# Patient Record
Sex: Female | Born: 1955
Health system: Southern US, Community
[De-identification: ages and names within clinical notes are randomized; demographics above are authoritative.]

## PROBLEM LIST (undated history)

## (undated) HISTORY — PX: HERNIA REPAIR: SHX51

---

## 2002-02-02 ENCOUNTER — Other Ambulatory Visit: Admission: RE | Admit: 2002-02-02 | Discharge: 2002-02-02 | Payer: Self-pay | Admitting: Family Medicine

## 2003-02-15 ENCOUNTER — Other Ambulatory Visit: Admission: RE | Admit: 2003-02-15 | Discharge: 2003-02-15 | Payer: Self-pay | Admitting: Family Medicine

## 2004-05-13 ENCOUNTER — Other Ambulatory Visit: Admission: RE | Admit: 2004-05-13 | Discharge: 2004-05-13 | Payer: Self-pay | Admitting: Family Medicine

## 2004-12-05 ENCOUNTER — Encounter: Admission: RE | Admit: 2004-12-05 | Discharge: 2004-12-05 | Payer: Self-pay | Admitting: Family Medicine

## 2005-01-15 ENCOUNTER — Encounter: Admission: RE | Admit: 2005-01-15 | Discharge: 2005-01-15 | Payer: Self-pay | Admitting: Family Medicine

## 2005-10-07 ENCOUNTER — Other Ambulatory Visit: Admission: RE | Admit: 2005-10-07 | Discharge: 2005-10-07 | Payer: Self-pay | Admitting: Family Medicine

## 2006-01-13 ENCOUNTER — Ambulatory Visit (HOSPITAL_BASED_OUTPATIENT_CLINIC_OR_DEPARTMENT_OTHER): Admission: RE | Admit: 2006-01-13 | Discharge: 2006-01-13 | Payer: Self-pay | Admitting: Orthopedic Surgery

## 2006-10-20 ENCOUNTER — Other Ambulatory Visit: Admission: RE | Admit: 2006-10-20 | Discharge: 2006-10-20 | Payer: Self-pay | Admitting: Family Medicine

## 2007-10-27 ENCOUNTER — Other Ambulatory Visit: Admission: RE | Admit: 2007-10-27 | Discharge: 2007-10-27 | Payer: Self-pay | Admitting: Family Medicine

## 2008-10-30 ENCOUNTER — Other Ambulatory Visit: Admission: RE | Admit: 2008-10-30 | Discharge: 2008-10-30 | Payer: Self-pay | Admitting: Family Medicine

## 2009-11-20 ENCOUNTER — Other Ambulatory Visit: Admission: RE | Admit: 2009-11-20 | Discharge: 2009-11-20 | Payer: Self-pay | Admitting: Family Medicine

## 2010-09-12 NOTE — Op Note (Signed)
Lydia Turner, Lydia Turner                ACCOUNT NO.:  [id number]   MEDICAL RECORD NO.:  [id number]          PATIENT TYPE:  AMB   LOCATION:  DSC                          FACILITY:  [facility]   PHYSICIAN:  Mila Homer. Sherlean Foot, M.D. DATE OF BIRTH:  08-28-1955   DATE OF PROCEDURE:  01/13/2006  DATE OF DISCHARGE:                                 OPERATIVE REPORT   SURGEON:  Lucey.   ASSISTANT:  None.   ANESTHESIA:  General.   PREOPERATIVE DIAGNOSIS:  Right knee, medial meniscus tear, osteoarthritis.   POSTOPERATIVE DIAGNOSIS:  Right knee, medial meniscus tear, lateral meniscus  tear; osteoarthritis.   INDICATIONS FOR PROCEDURE:  Patient is a 55 year old black female with a can  of symptoms, MRI evidence of meniscus tearing and radiographic evidence of  arthritis.  Informed consent was obtained.   DESCRIPTION OF PROCEDURE:  The patient was laid supine and administered MAC  anesthesia at first, and we converted to a general LMA.  The right lower  extremity was prepped and draped in the usual sterile fashion.  Inferolateral and inferomedial portals were created with a ##11 blade, blunt  trocar and cannula.  Diagnostic arthroscopy revealed grade 3 and 4  chondromalacia on the left facet of the patella and trochlea.  Chondroplasty  was performed on both surfaces with the 4.2 [employer] shaver.  Then went  into the medial compartment.  There was a very large, very complex posterior  horn tear which was flipped into the posterior joint.  It took a great deal  of effort to get that back into the joint, and then a partial medial  meniscectomy was performed with straight and upbiting basket forceps and a  Biochemist, clinical.  I then went into the figure-of-4 position.  There was  some radial tearing of the lateral meniscus.  This was cleaned up with a  straight-basket forceps and the [employer].  There was some grade 3  chondromalacia on the tibial plateau in the lateral compartment and some  grade 2 in the majority of the medial femoral condyle.  ACL and PCL appeared  normal.  I then took one further tour with lavage and then closed with 4-0  nylon sutures.  Dressed with Xeroform dressing.   COMPLICATIONS:  None.   DRAINS:  None.           ______________________________  Mila Homer. Sherlean Foot, M.D.     SDL/MEDQ  D:  01/13/2006  T:  01/13/2006  Job:  [id number]

## 2010-11-25 ENCOUNTER — Other Ambulatory Visit (HOSPITAL_COMMUNITY)
Admission: RE | Admit: 2010-11-25 | Discharge: 2010-11-25 | Disposition: A | Discharge status: Home / Self Care | Payer: BC Managed Care – PPO | Source: Ambulatory Visit | Attending: Family Medicine | Admitting: Family Medicine

## 2010-11-25 DIAGNOSIS — Z01419 Encounter for gynecological examination (general) (routine) without abnormal findings: Secondary | ICD-10-CM | POA: Insufficient documentation

## 2011-06-07 ENCOUNTER — Emergency Department (INDEPENDENT_AMBULATORY_CARE_PROVIDER_SITE_OTHER): Payer: Worker's Compensation

## 2011-06-07 ENCOUNTER — Encounter (HOSPITAL_BASED_OUTPATIENT_CLINIC_OR_DEPARTMENT_OTHER): Payer: Self-pay | Admitting: *Deleted

## 2011-06-07 ENCOUNTER — Emergency Department (HOSPITAL_BASED_OUTPATIENT_CLINIC_OR_DEPARTMENT_OTHER)
Admission: EM | Admit: 2011-06-07 | Discharge: 2011-06-07 | Disposition: A | Discharge status: Home / Self Care | Payer: Worker's Compensation | Attending: Emergency Medicine | Admitting: Emergency Medicine

## 2011-06-07 DIAGNOSIS — S8002XA Contusion of left knee, initial encounter: Secondary | ICD-10-CM

## 2011-06-07 DIAGNOSIS — W010XXA Fall on same level from slipping, tripping and stumbling without subsequent striking against object, initial encounter: Secondary | ICD-10-CM | POA: Insufficient documentation

## 2011-06-07 DIAGNOSIS — S8000XA Contusion of unspecified knee, initial encounter: Secondary | ICD-10-CM | POA: Insufficient documentation

## 2011-06-07 DIAGNOSIS — M25569 Pain in unspecified knee: Secondary | ICD-10-CM

## 2011-06-07 NOTE — ED Notes (Signed)
Pt states her foot got caught in some luggage and she fell on her left knee.

## 2011-06-07 NOTE — ED Provider Notes (Signed)
History     CSN: [id number]  Arrival date & time 06/07/11  7829   First MD Initiated Contact with Patient 06/07/11 2028      Chief Complaint  Patient presents with  . Knee Pain    (Consider location/radiation/quality/duration/timing/severity/associated sxs/prior treatment) Patient is a 56 y.o. female presenting with knee pain. The history is provided by the patient. No language interpreter was used.  Knee Pain This is a new problem. The current episode started today. The problem occurs constantly. The problem has been unchanged. Associated symptoms include joint swelling. The symptoms are aggravated by bending. She has tried ice for the symptoms. The treatment provided mild relief.  Pt reports she tripped over a ski bag and fell hitting her knee.  Pt complains of swelling and bruising.  No other areas of injury.  History reviewed. No pertinent past medical history.  Past Surgical History  Procedure Date  . Hernia repair   . Cesarean section     History reviewed. No pertinent family history.  History  Substance Use Topics  . Smoking status: Never Smoker   . Smokeless tobacco: Not on file  . Alcohol Use: No    OB History    Grav Para Term Preterm Abortions TAB SAB Ect Mult Living                  Review of Systems  Musculoskeletal: Positive for joint swelling.  All other systems reviewed and are negative.    Allergies  Ibuprofen and Peanut-containing drug products  Home Medications   Current Outpatient Rx  Name Route Sig Dispense Refill  . COD LIVER OIL PO CAPS Oral Take 1 capsule by mouth daily.      BP 119/84  Pulse 94  Temp(Src) 98.8 F (37.1 C) (Oral)  Resp 18  Ht 5\' 2"  (1.575 m)  Wt 210 lb (95.255 kg)  BMI 38.41 kg/m2  SpO2 100%  Physical Exam  Vitals reviewed. Constitutional: She is oriented to person, place, and time. She appears well-developed and well-nourished.  HENT:  Head: Normocephalic.  Musculoskeletal: Normal range of motion. She  exhibits tenderness.       12x10 cm area of bruising left leg and left knee,  From knee,  No instability med or lat  Negative drawer,  nv and ns intact  Neurological: She is alert and oriented to person, place, and time.  Skin: Skin is warm. There is erythema.  Psychiatric: She has a normal mood and affect.    ED Course  Procedures (including critical care time)  Labs Reviewed - No data to display Dg Knee Complete 4 Views Left  06/07/2011  *RADIOLOGY REPORT*  Clinical Data: Left knee pain.  LEFT KNEE - COMPLETE 4+ VIEW  Comparison: None  Findings: There are moderate degenerative changes but no acute fracture or osteochondral abnormality.  No joint effusion.  IMPRESSION: Moderate degenerative changes but no acute fracture.  Original Report Authenticated By: P. Loralie Champagne, M.D.     No diagnosis found.    MDM  Ace wrap,  I advised ice,  Return if any problems.        Langston Masker, [place] 06/07/11 2101

## 2011-06-08 NOTE — ED Provider Notes (Signed)
Medical screening examination/treatment/procedure(s) were performed by non-physician practitioner and as supervising physician I was immediately available for consultation/collaboration.   Forbes Cellar, MD 06/08/11 [phone]

## 2011-12-25 ENCOUNTER — Other Ambulatory Visit (HOSPITAL_COMMUNITY)
Admission: RE | Admit: 2011-12-25 | Discharge: 2011-12-25 | Disposition: A | Discharge status: Home / Self Care | Payer: BC Managed Care – PPO | Source: Ambulatory Visit | Attending: Family Medicine | Admitting: Family Medicine

## 2011-12-25 DIAGNOSIS — Z01419 Encounter for gynecological examination (general) (routine) without abnormal findings: Secondary | ICD-10-CM | POA: Insufficient documentation

## 2013-07-24 IMAGING — CR DG KNEE COMPLETE 4+V*L*
4 series · 4 of 4 positions shown · non-contrast
Comparison: None

CLINICAL DATA: Left knee pain.

LEFT KNEE - COMPLETE 4+ VIEW

[t knee ap left]
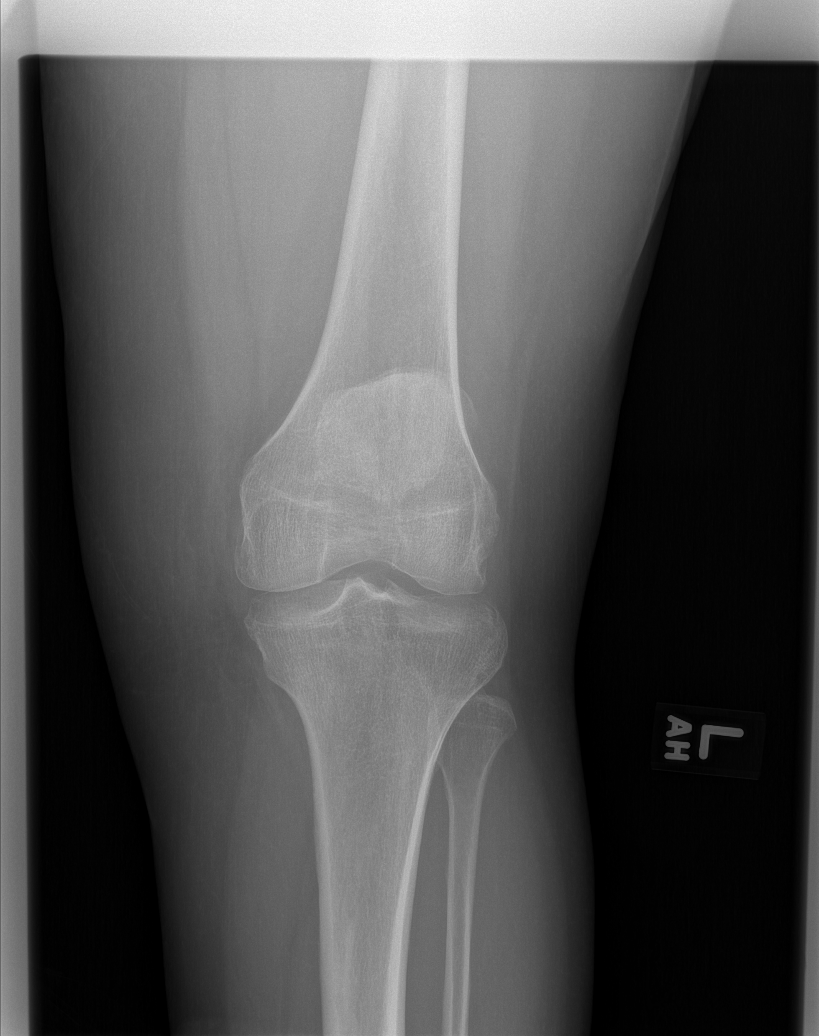

[t knee oblique left (1 of 2)]
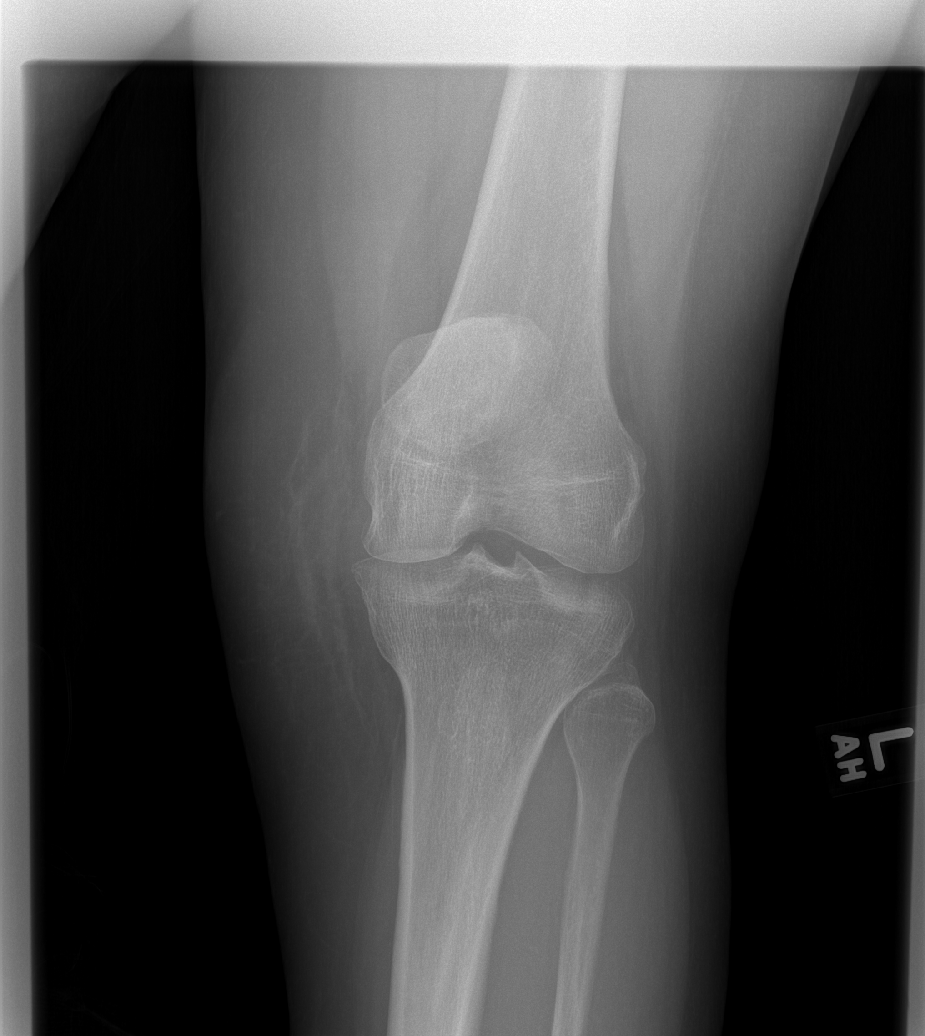

[t knee oblique left (2 of 2)]
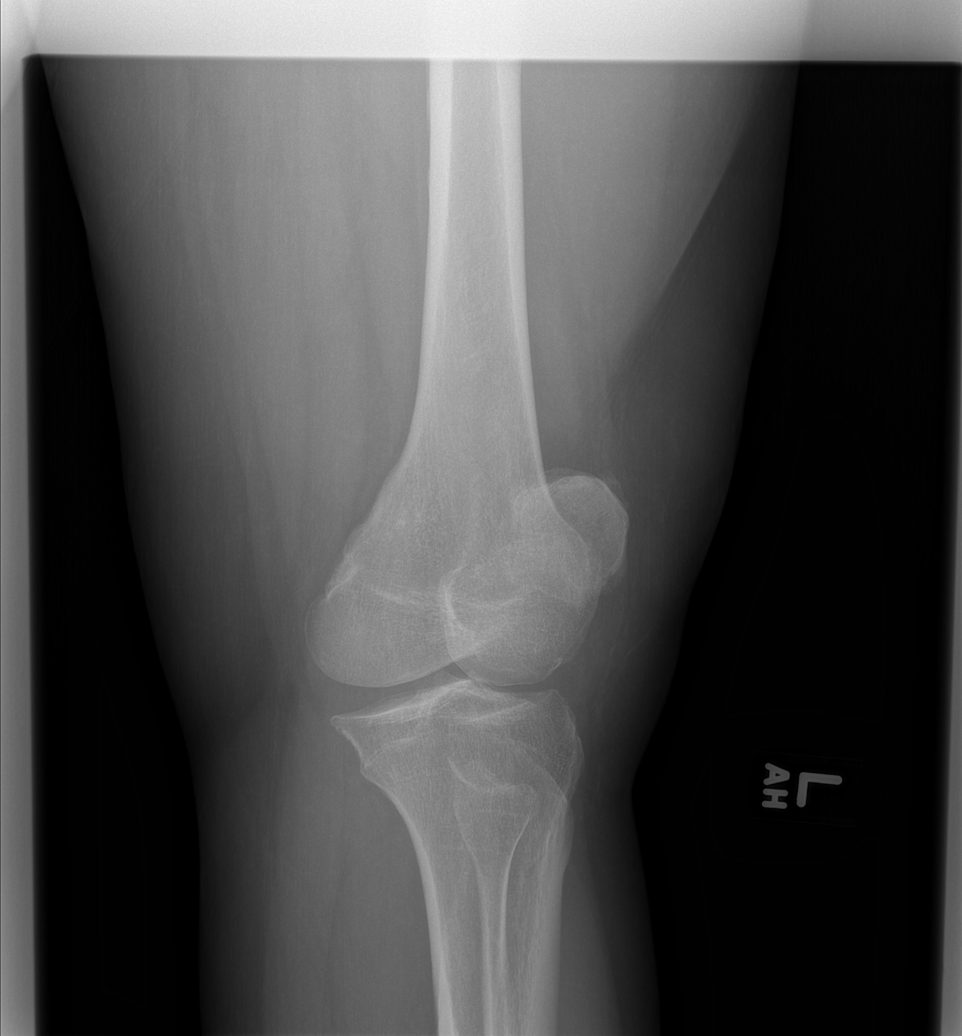

[t knee lat left]
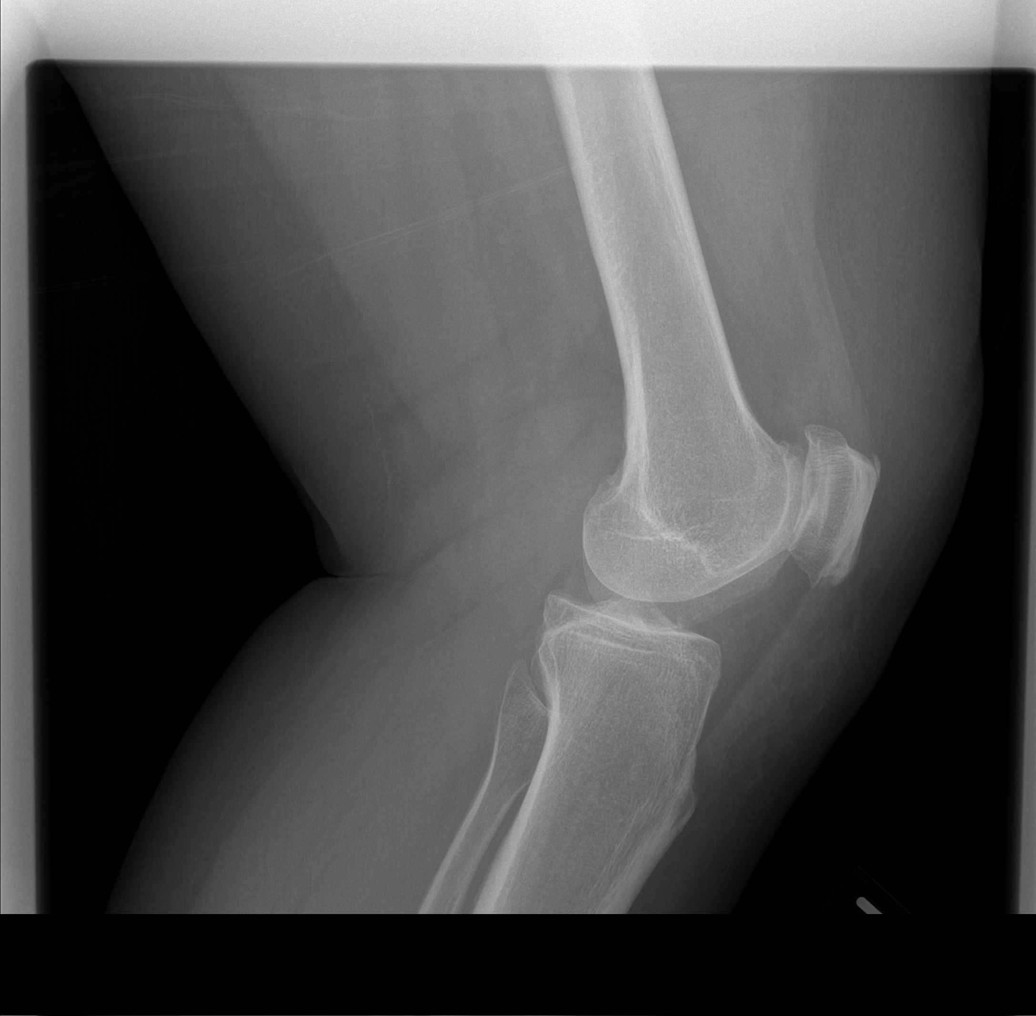

[4 of 4 positions shown; findings below may reference images not displayed]

FINDINGS: There are moderate degenerative changes but no acute
fracture or osteochondral abnormality.  No joint effusion.
IMPRESSION: Moderate degenerative changes but no acute fracture.

## 2013-12-27 ENCOUNTER — Other Ambulatory Visit (HOSPITAL_COMMUNITY)
Admission: RE | Admit: 2013-12-27 | Discharge: 2013-12-27 | Disposition: A | Discharge status: Home / Self Care | Payer: BC Managed Care – PPO | Source: Ambulatory Visit | Attending: Family Medicine | Admitting: Family Medicine

## 2013-12-27 DIAGNOSIS — Z01419 Encounter for gynecological examination (general) (routine) without abnormal findings: Secondary | ICD-10-CM | POA: Diagnosis present

## 2013-12-27 DIAGNOSIS — Z1151 Encounter for screening for human papillomavirus (HPV): Secondary | ICD-10-CM | POA: Insufficient documentation

## 2015-01-01 ENCOUNTER — Other Ambulatory Visit (HOSPITAL_COMMUNITY)
Admission: RE | Admit: 2015-01-01 | Discharge: 2015-01-01 | Disposition: A | Discharge status: Home / Self Care | Payer: BLUE CROSS/BLUE SHIELD | Source: Ambulatory Visit | Attending: Family Medicine | Admitting: Family Medicine

## 2015-01-01 DIAGNOSIS — Z1151 Encounter for screening for human papillomavirus (HPV): Secondary | ICD-10-CM | POA: Diagnosis present

## 2015-01-01 DIAGNOSIS — Z01419 Encounter for gynecological examination (general) (routine) without abnormal findings: Secondary | ICD-10-CM | POA: Insufficient documentation

## 2016-01-07 ENCOUNTER — Other Ambulatory Visit: Payer: Self-pay | Admitting: Family Medicine

## 2016-01-07 ENCOUNTER — Other Ambulatory Visit (HOSPITAL_COMMUNITY)
Admission: RE | Admit: 2016-01-07 | Discharge: 2016-01-07 | Disposition: A | Discharge status: Home / Self Care | Payer: BLUE CROSS/BLUE SHIELD | Source: Ambulatory Visit | Attending: Family Medicine | Admitting: Family Medicine

## 2016-01-07 DIAGNOSIS — Z1151 Encounter for screening for human papillomavirus (HPV): Secondary | ICD-10-CM | POA: Insufficient documentation

## 2016-01-07 DIAGNOSIS — Z01419 Encounter for gynecological examination (general) (routine) without abnormal findings: Secondary | ICD-10-CM | POA: Diagnosis present

## 2016-01-09 LAB — CYTOLOGY - PAP

## 2017-01-22 ENCOUNTER — Ambulatory Visit (INDEPENDENT_AMBULATORY_CARE_PROVIDER_SITE_OTHER): Payer: BLUE CROSS/BLUE SHIELD | Admitting: Podiatry

## 2017-01-22 ENCOUNTER — Ambulatory Visit (INDEPENDENT_AMBULATORY_CARE_PROVIDER_SITE_OTHER): Payer: BLUE CROSS/BLUE SHIELD

## 2017-01-22 ENCOUNTER — Encounter: Payer: Self-pay | Admitting: Podiatry

## 2017-01-22 VITALS — BP 127/75 | HR 65

## 2017-01-22 DIAGNOSIS — M2010 Hallux valgus (acquired), unspecified foot: Secondary | ICD-10-CM

## 2017-01-22 NOTE — Progress Notes (Signed)
   Subjective:    Patient ID: Lydia Turner, female    DOB: [birth year], 61 y.o.   MRN: [id number]  HPI  Chief Complaint  Patient presents with  . Foot Pain    Rt foot possible bunion       Review of Systems  Musculoskeletal: Positive for arthralgias.  All other systems reviewed and are negative.      Objective:   Physical Exam        Assessment & Plan:

## 2017-01-22 NOTE — Patient Instructions (Signed)

## 2017-01-22 NOTE — Progress Notes (Signed)
Subjective:    Patient ID: Lydia Turner, female   DOB: 61 y.o.   MRN: [id number]   HPI patient states that she's had a painful bunion deformity right over left that's been present for a while and getting worse and making shoe gear difficult. She's tried changes and shoe gear soaks to try to help and patient states she's not a smoker and does like to be active    Review of Systems  All other systems reviewed and are negative.       Objective:  Physical Exam  Constitutional: She appears well-developed and well-nourished.  Cardiovascular: Intact distal pulses.   Musculoskeletal: Normal range of motion.  Neurological: She is alert.  Skin: Skin is warm.  Nursing note and vitals reviewed.  neurovascular status intact muscle strength adequate range of motion within normal limits with patient found to have hyperostosis medial aspect first metatarsal right over left with redness and moderate discomfort when the right is palpated. Patient is noted to have good digital perfusion and is well oriented 3     Assessment:    Structural HAV deformity right over left with inflammation pain and gradual increase in symptoms     Plan:    H&P condition reviewed and at this point I recommended long-term surgical intervention and short-term shoe gear modification. Patient wants to surgery but is not sure on schedule and will call to schedule and will be seen back prior to procedure and I educated her on distal osteotomy. Patient encouraged to call with questions  X-rays indicate elevation of the 1 to intermetatarsal angle right over left of approximate 15

## 2017-07-22 ENCOUNTER — Encounter: Payer: BLUE CROSS/BLUE SHIELD | Attending: Psychology | Admitting: Psychology

## 2017-07-22 DIAGNOSIS — F09 Unspecified mental disorder due to known physiological condition: Secondary | ICD-10-CM | POA: Insufficient documentation

## 2017-07-29 ENCOUNTER — Encounter: Payer: Self-pay | Admitting: Psychology

## 2017-07-29 ENCOUNTER — Encounter: Payer: BLUE CROSS/BLUE SHIELD | Attending: Psychology | Admitting: Psychology

## 2017-07-29 DIAGNOSIS — F09 Unspecified mental disorder due to known physiological condition: Secondary | ICD-10-CM | POA: Diagnosis present

## 2017-07-29 DIAGNOSIS — F0789 Other personality and behavioral disorders due to known physiological condition: Secondary | ICD-10-CM | POA: Diagnosis not present

## 2017-07-29 NOTE — Progress Notes (Signed)
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  2 hours testing face to face administered by Physician/Psychologist

## 2017-08-05 ENCOUNTER — Encounter: Payer: Self-pay | Admitting: Psychology

## 2017-08-05 ENCOUNTER — Encounter (HOSPITAL_BASED_OUTPATIENT_CLINIC_OR_DEPARTMENT_OTHER): Payer: BLUE CROSS/BLUE SHIELD | Admitting: Psychology

## 2017-08-05 DIAGNOSIS — F09 Unspecified mental disorder due to known physiological condition: Secondary | ICD-10-CM | POA: Diagnosis not present
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
## 2017-08-26 ENCOUNTER — Telehealth: Payer: Self-pay

## 2017-08-26 NOTE — Telephone Encounter (Signed)
Pt called stating her job needed an update.

## 2017-09-01 NOTE — Telephone Encounter (Signed)
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 She states fax it to [employer] and you have the number.

## 2017-09-13 ENCOUNTER — Encounter: Payer: BLUE CROSS/BLUE SHIELD | Attending: Psychology | Admitting: Psychology

## 2017-09-13 DIAGNOSIS — F09 Unspecified mental disorder due to known physiological condition: Secondary | ICD-10-CM | POA: Diagnosis not present

## 2017-09-14 ENCOUNTER — Encounter: Payer: Self-pay | Admitting: Psychology
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
## 2023-03-11 ENCOUNTER — Other Ambulatory Visit: Payer: Self-pay | Admitting: Family Medicine

## 2023-03-11 ENCOUNTER — Other Ambulatory Visit (HOSPITAL_COMMUNITY)
Admission: RE | Admit: 2023-03-11 | Discharge: 2023-03-11 | Disposition: A | Discharge status: Home / Self Care | Payer: Managed Care, Other (non HMO) | Source: Ambulatory Visit | Attending: Family Medicine | Admitting: Family Medicine

## 2023-03-11 DIAGNOSIS — Z1272 Encounter for screening for malignant neoplasm of vagina: Secondary | ICD-10-CM | POA: Diagnosis present

## 2023-03-12 LAB — CYTOLOGY - PAP: Diagnosis: NEGATIVE
